# Patient Record
Sex: Female | Born: 1970 | Race: White | Hispanic: No
Health system: Southern US, Community
[De-identification: ages and names within clinical notes are randomized; demographics above are authoritative.]

---

## 2013-08-02 ENCOUNTER — Other Ambulatory Visit: Payer: Self-pay | Admitting: Family Medicine

## 2013-08-02 DIAGNOSIS — R109 Unspecified abdominal pain: Secondary | ICD-10-CM

## 2013-08-03 ENCOUNTER — Ambulatory Visit (INDEPENDENT_AMBULATORY_CARE_PROVIDER_SITE_OTHER): Payer: 59

## 2013-08-03 DIAGNOSIS — R109 Unspecified abdominal pain: Secondary | ICD-10-CM

## 2015-04-29 IMAGING — US US ABDOMEN COMPLETE
1 series · 14 of 25 positions shown · non-contrast
Comparison: None.

CLINICAL DATA: Abdominal pain

EXAM:
ULTRASOUND ABDOMEN COMPLETE

[Series 1: us abdomen complete · 0.28mm/px · 14 of 73 slices shown]
[im 1/73]
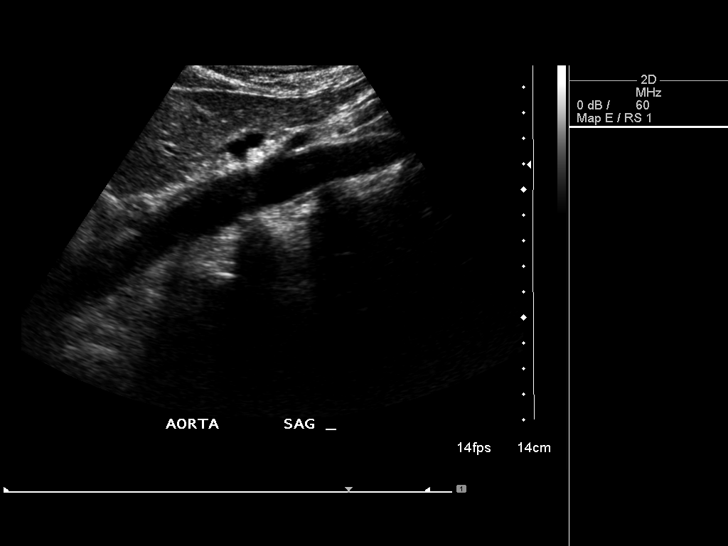
[im 7/73]
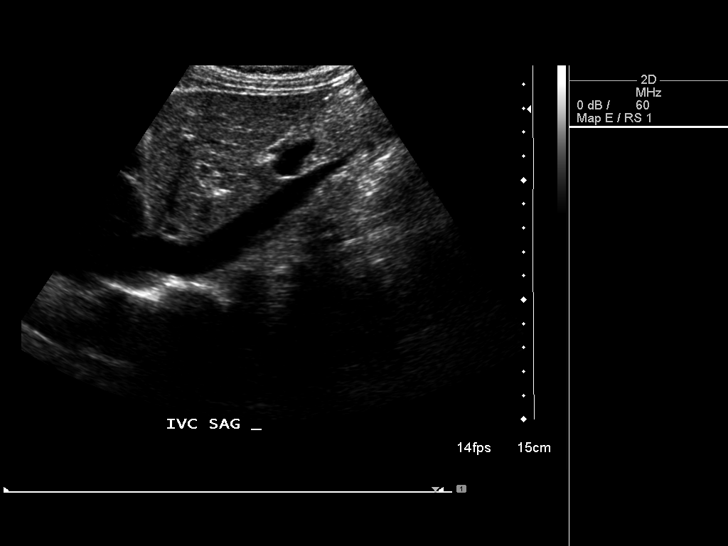
[im 13/73]
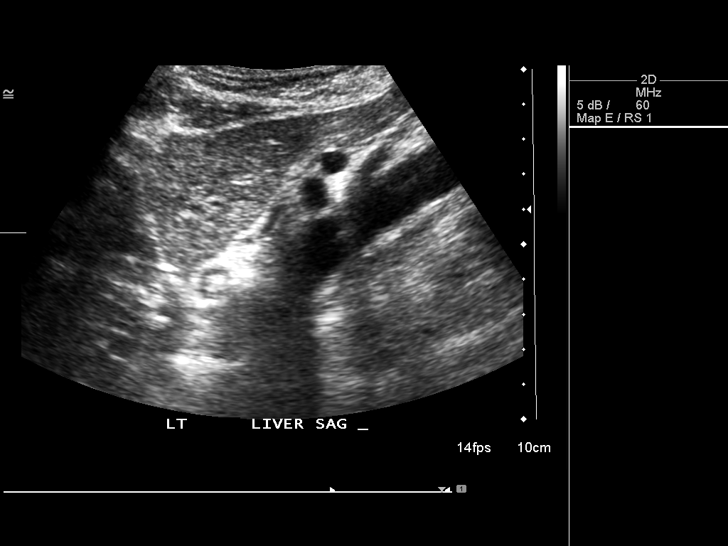
[im 19/73]
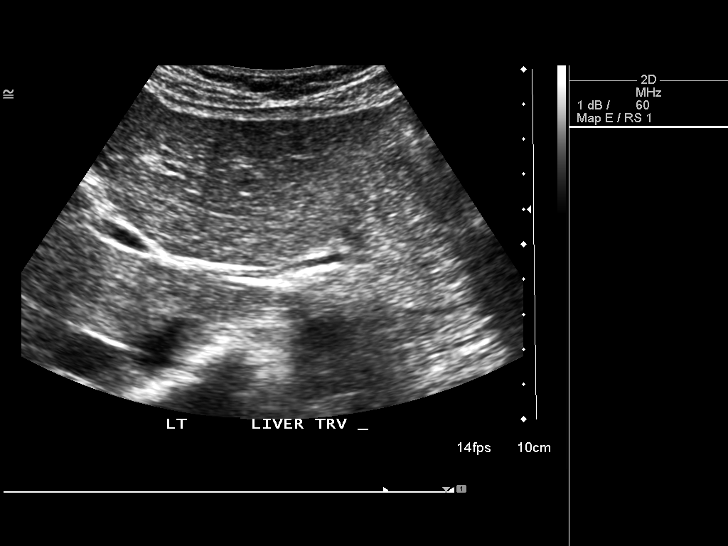
[im 25/73]
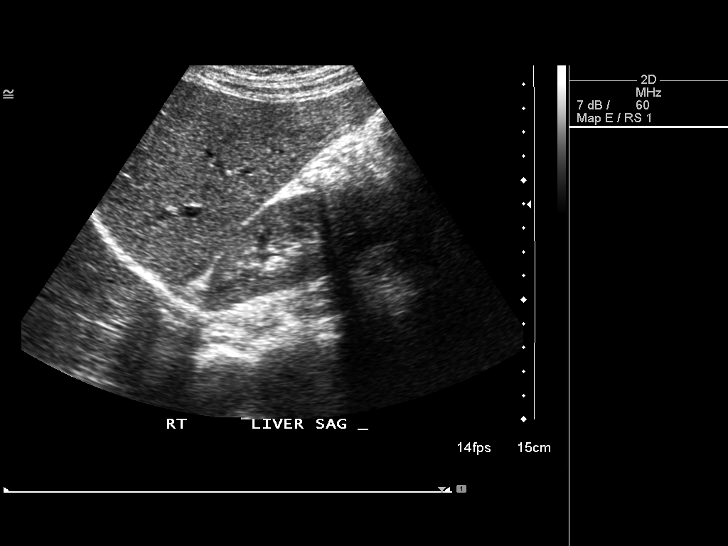
[im 28/73]
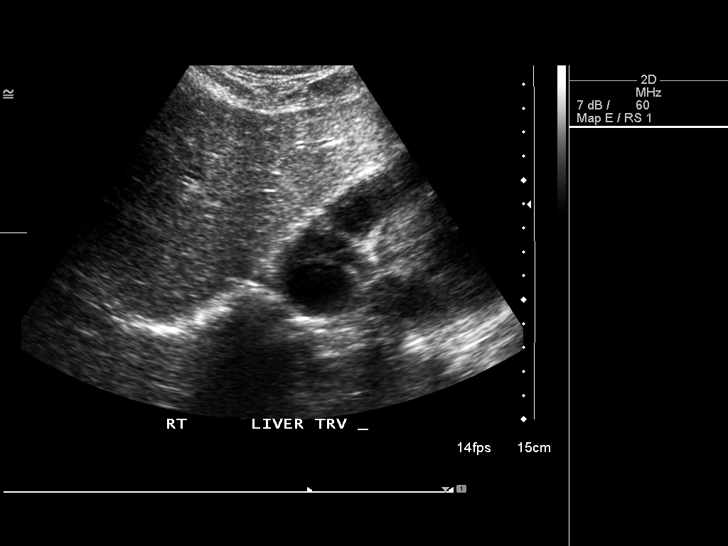
[im 34/73]
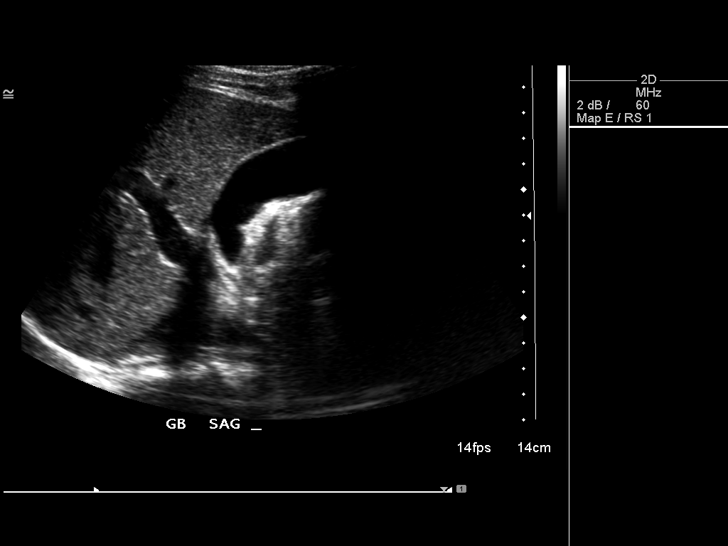
[im 40/73]
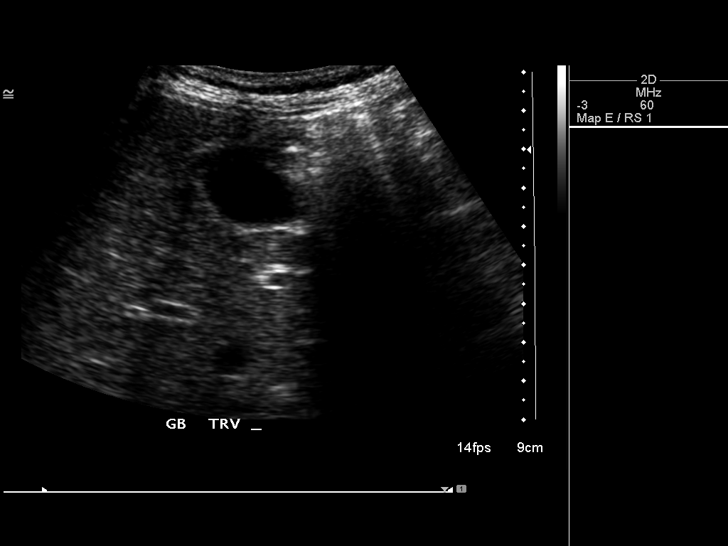
[im 46/73]
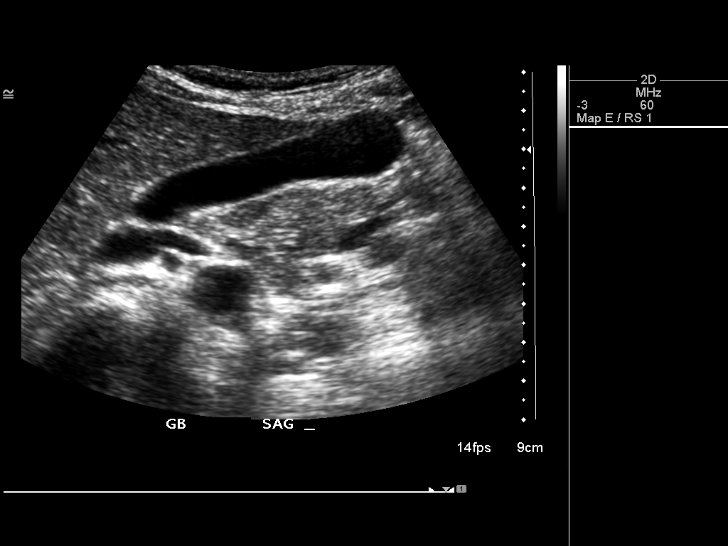
[im 49/73]
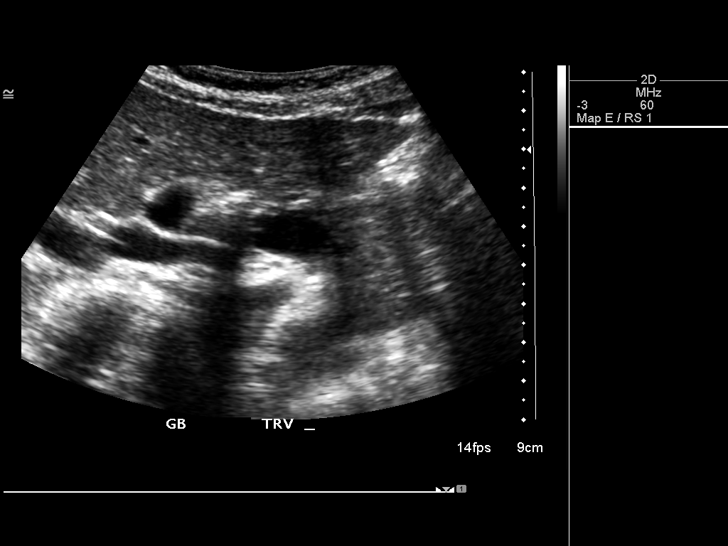
[im 55/73]
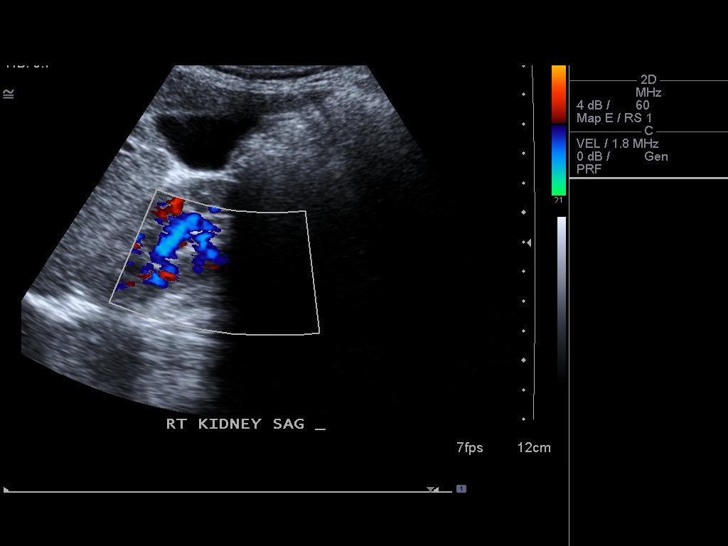
[im 61/73]
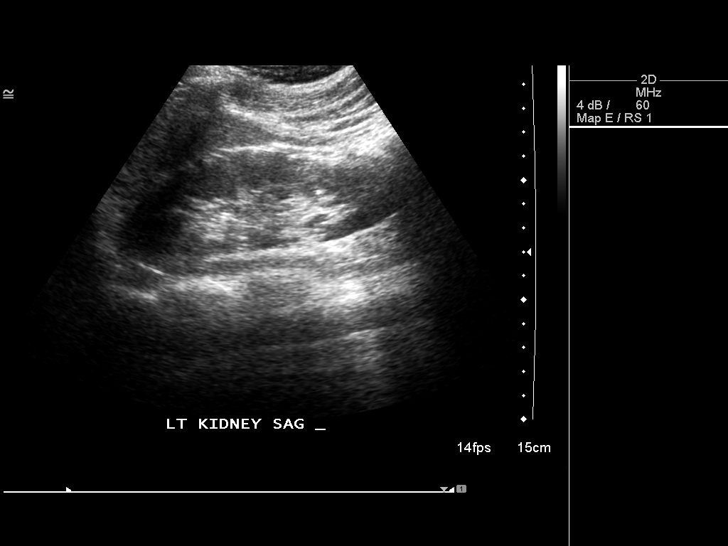
[im 67/73]
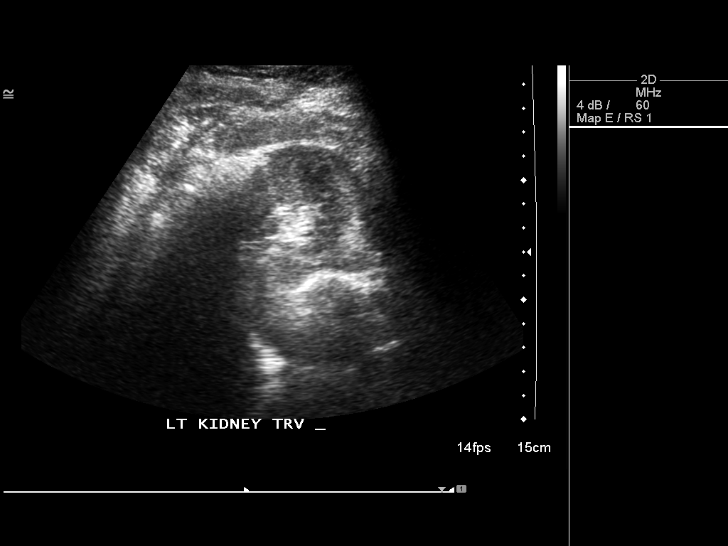
[im 73/73]
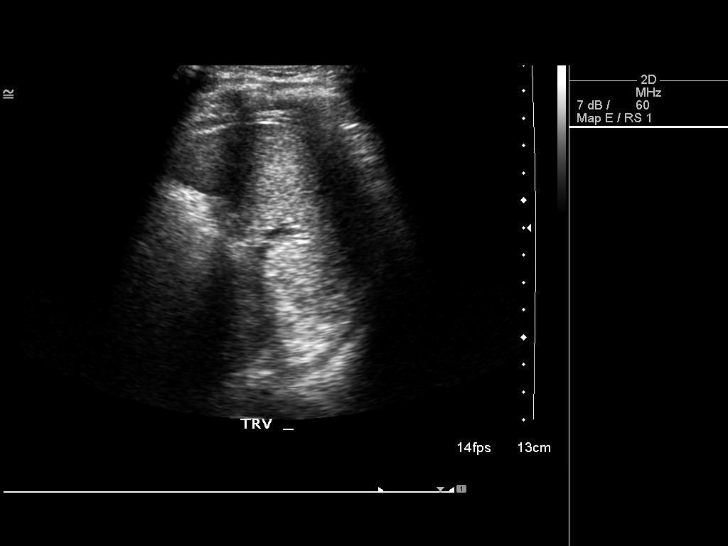

[14 of 25 positions shown; findings below may reference images not displayed]

FINDINGS: Overall limited exam because of obscuring bowel gas throughout the
abdomen.

Gallbladder:

No gallstones or wall thickening visualized. No sonographic Murphy
sign noted.

Common bile duct:

Diameter: 1.5 mm

Liver:

No focal lesion identified. Within normal limits in parenchymal
echogenicity.

IVC:

No abnormality visualized.

Pancreas:

Visualized portion unremarkable.

Spleen:

Size and appearance within normal limits.

Right Kidney:

Length: 9.6 cm. Echogenicity within normal limits. No mass or
hydronephrosis visualized.

Left Kidney:

Length: 12.2 cm. Echogenicity within normal limits. No mass or
hydronephrosis visualized.

Abdominal aorta:

No aneurysm visualized.

Other findings:

None.
IMPRESSION: Limited exam related to bowel gas. No acute finding demonstrated by
ultrasound.

## 2019-01-19 ENCOUNTER — Encounter: Payer: Self-pay | Admitting: Family Medicine

## 2019-01-19 ENCOUNTER — Other Ambulatory Visit: Payer: Self-pay

## 2019-01-19 ENCOUNTER — Ambulatory Visit: Payer: Self-pay

## 2019-01-19 ENCOUNTER — Ambulatory Visit (INDEPENDENT_AMBULATORY_CARE_PROVIDER_SITE_OTHER): Payer: 59 | Admitting: Family Medicine

## 2019-01-19 VITALS — Ht 64.0 in | Wt 120.0 lb

## 2019-01-19 DIAGNOSIS — M542 Cervicalgia: Secondary | ICD-10-CM | POA: Diagnosis not present

## 2019-01-19 MED ORDER — NABUMETONE 750 MG PO TABS: 750.0000 mg | ORAL_TABLET | Freq: Two times a day (BID) | ORAL | 6 refills | Status: AC | PRN

## 2019-01-19 MED ORDER — TIZANIDINE HCL 2 MG PO TABS: 2.0000 mg | ORAL_TABLET | Freq: Every evening | ORAL | 1 refills | Status: AC | PRN

## 2019-01-19 NOTE — Progress Notes (Signed)
   Office Visit Note   Patient: Julie Paul           Date of Birth: [birth year]           MRN: [id number] Visit Date: 01/19/2019 Requested by: No referring provider defined for this encounter. PCP: Blair Heys, PA-C  Subjective: Chief Complaint  Patient presents with  . Neck - Pain  . Neck Pain    on going neck pain for that last few months , she was follow up with a chiroprator hasn't help with pain , no pain in arms pain when turn head     HPI: She is seen at the request of Dr. Imagene Sheller for right-sided neck pain.  Symptoms started about 3 or 4 months ago, no injury.  Gradual onset of pain with occasional sensation of weakness in her arm.  She has been going to Dr. Imagene Sheller regularly but treatment relief is only short-lived.  Denies any remote trauma to her neck.  She is right-hand dominant.  She is not taking medication for her pain.  She modified her workstation to make it more ergonomically correct but it still has not improved her pain.  She has trouble sleeping at night because of her pain.               ROS: No fevers or chills.  All other systems were reviewed and are negative.  Objective: Vital Signs: Ht 5\' 4"  (1.626 m)   Wt 120 lb (54.4 kg)   BMI 20.60 kg/m   Physical Exam:  General:  Alert and oriented, in no acute distress. Pulm:  Breathing unlabored. Psy:  Normal mood, congruent affect. Skin: No rash or erythema. Neck: She has full range of motion in all directions, negative Spurling's test.  She has tender trigger points to the right of midline around C5-6 and C6-7.  She has others and the trapezius belly and the rhomboid region.  She has 5/5 deltoid, rotator cuff, triceps, wrist and hand strength but she has very slight weakness with right biceps flexion, 4+/5 compared to normal strength on the left.  DTRs are 2+ and symmetric.  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    Assessment & Plan: 1.  Neck and right arm pain with subtle right biceps weakness, concerning for disc protrusion with nerve impingement. -Discussed options with her, her husband has been to [facility] physical therapy in the past with good results and she would like to try that.  I gave her Relafen for inflammation and Zanaflex for muscle spasm.  If she fails to improve she will contact me and I will order cervical MRI scan followed by epidural steroid injections if indicated.     Procedures: No procedures performed  No notes on file     PMFS History: There are no active problems to display for this patient.  History reviewed. No pertinent past medical history.  History reviewed. No pertinent family history.  History reviewed. No pertinent surgical history. Social History   Occupational History  . Not on file  Tobacco Use  . Smoking status: Not on file  Substance and Sexual Activity  . Alcohol use: Not on file  . Drug use: Not on file  . Sexual activity: Not on file
